# Patient Record
Sex: Male | Born: 1955 | Race: White | Hispanic: No | Marital: Married
Health system: Southern US, Community
[De-identification: ages and names within clinical notes are randomized; demographics above are authoritative.]

---

## 2003-07-14 ENCOUNTER — Ambulatory Visit (HOSPITAL_COMMUNITY): Admission: RE | Admit: 2003-07-14 | Discharge: 2003-07-14 | Payer: Self-pay | Admitting: Surgery

## 2003-07-15 ENCOUNTER — Ambulatory Visit (HOSPITAL_BASED_OUTPATIENT_CLINIC_OR_DEPARTMENT_OTHER): Admission: RE | Admit: 2003-07-15 | Discharge: 2003-07-15 | Payer: Self-pay | Admitting: Surgery

## 2010-09-08 ENCOUNTER — Other Ambulatory Visit: Payer: Self-pay

## 2010-09-21 ENCOUNTER — Ambulatory Visit: Payer: Self-pay | Admitting: Infectious Diseases

## 2015-06-30 ENCOUNTER — Other Ambulatory Visit: Payer: Self-pay | Admitting: Family Medicine

## 2015-06-30 ENCOUNTER — Ambulatory Visit
Admission: RE | Admit: 2015-06-30 | Discharge: 2015-06-30 | Disposition: A | Discharge status: Home / Self Care | Payer: BLUE CROSS/BLUE SHIELD | Source: Ambulatory Visit | Attending: Family Medicine | Admitting: Family Medicine

## 2015-06-30 DIAGNOSIS — R05 Cough: Secondary | ICD-10-CM

## 2015-06-30 DIAGNOSIS — R059 Cough, unspecified: Secondary | ICD-10-CM

## 2016-04-24 IMAGING — CR DG CHEST 2V
2 series · 2 of 2 positions shown · non-contrast
Comparison: None.

CLINICAL DATA: Cough and congestion for 5 days

EXAM:
CHEST  2 VIEW

[w chest pa]
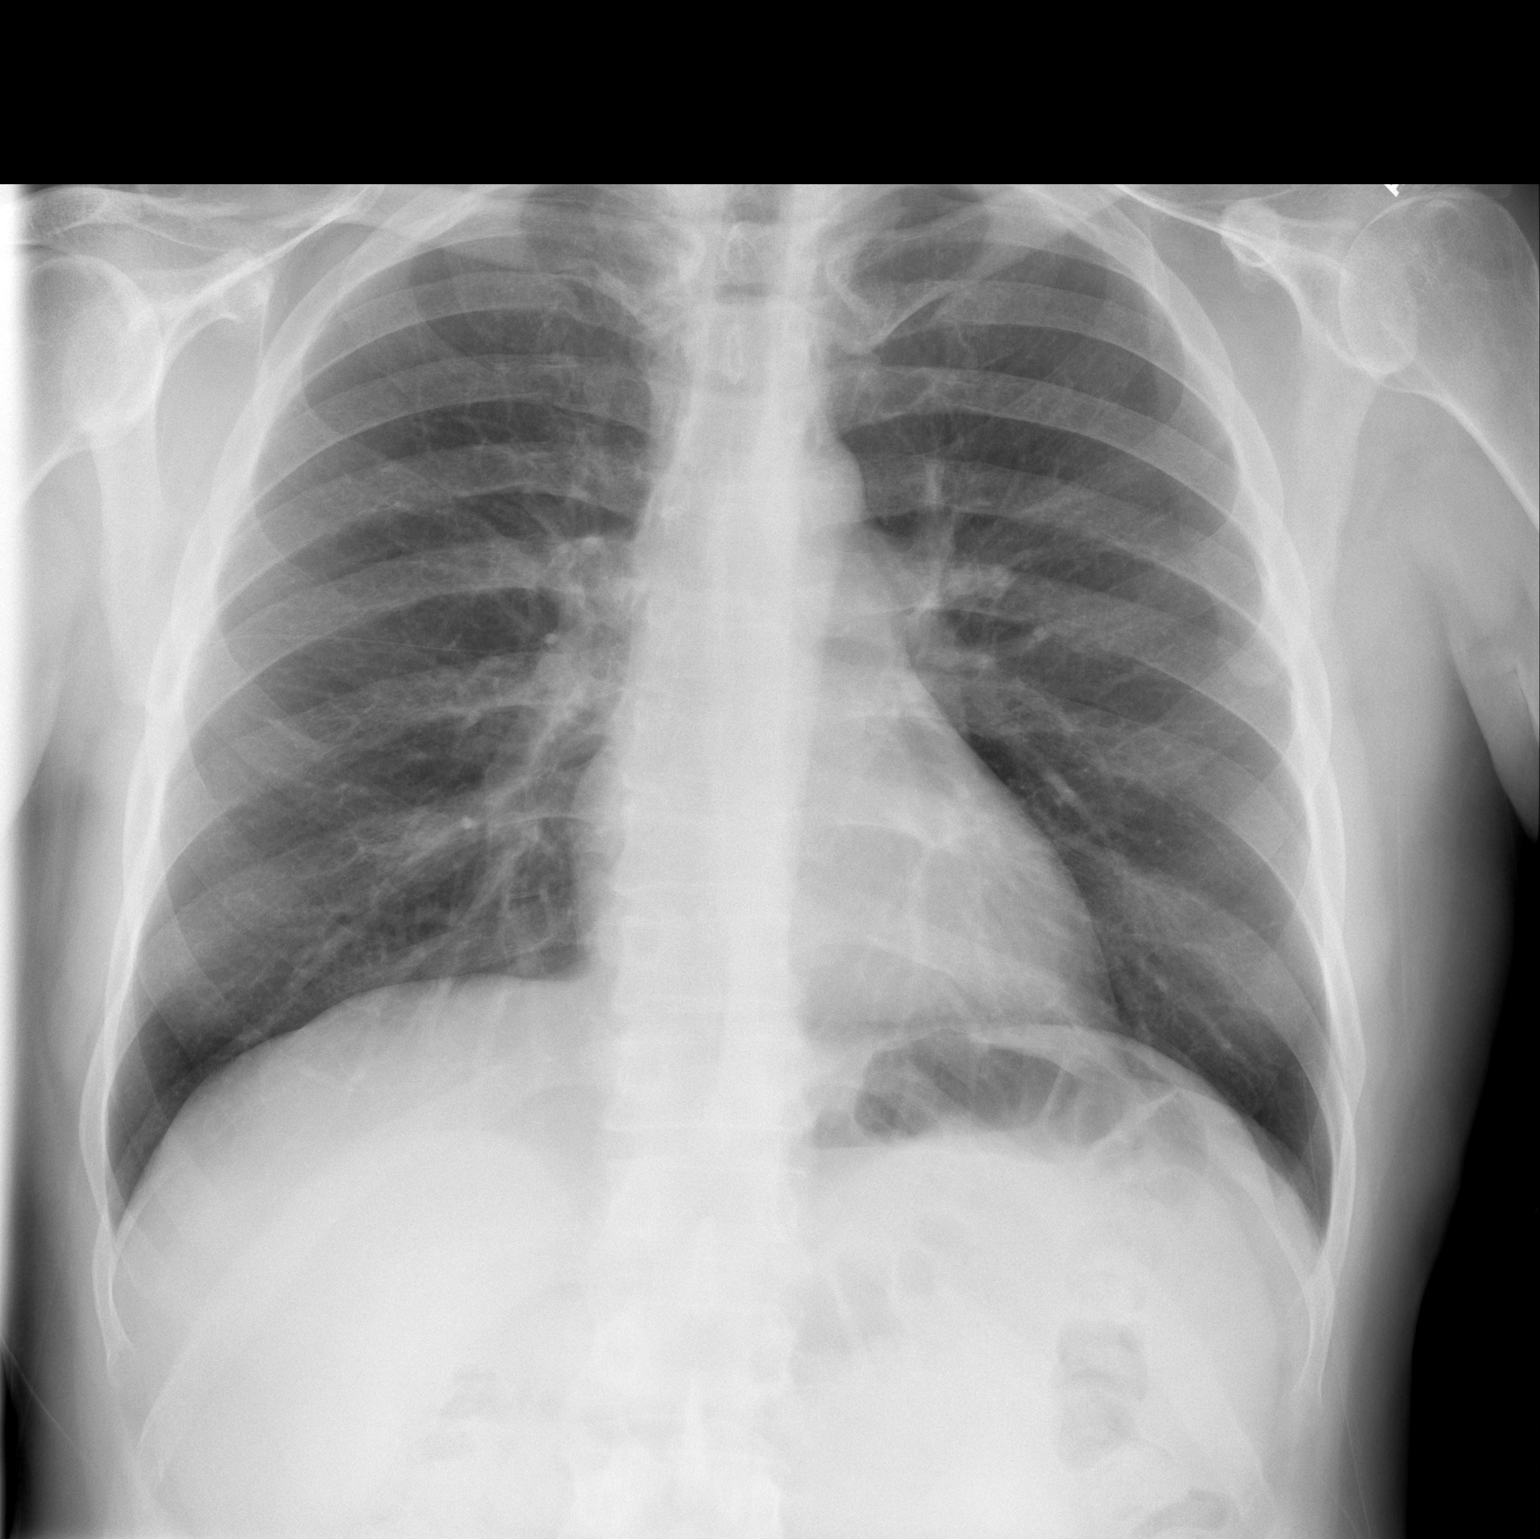

[w chest lat]
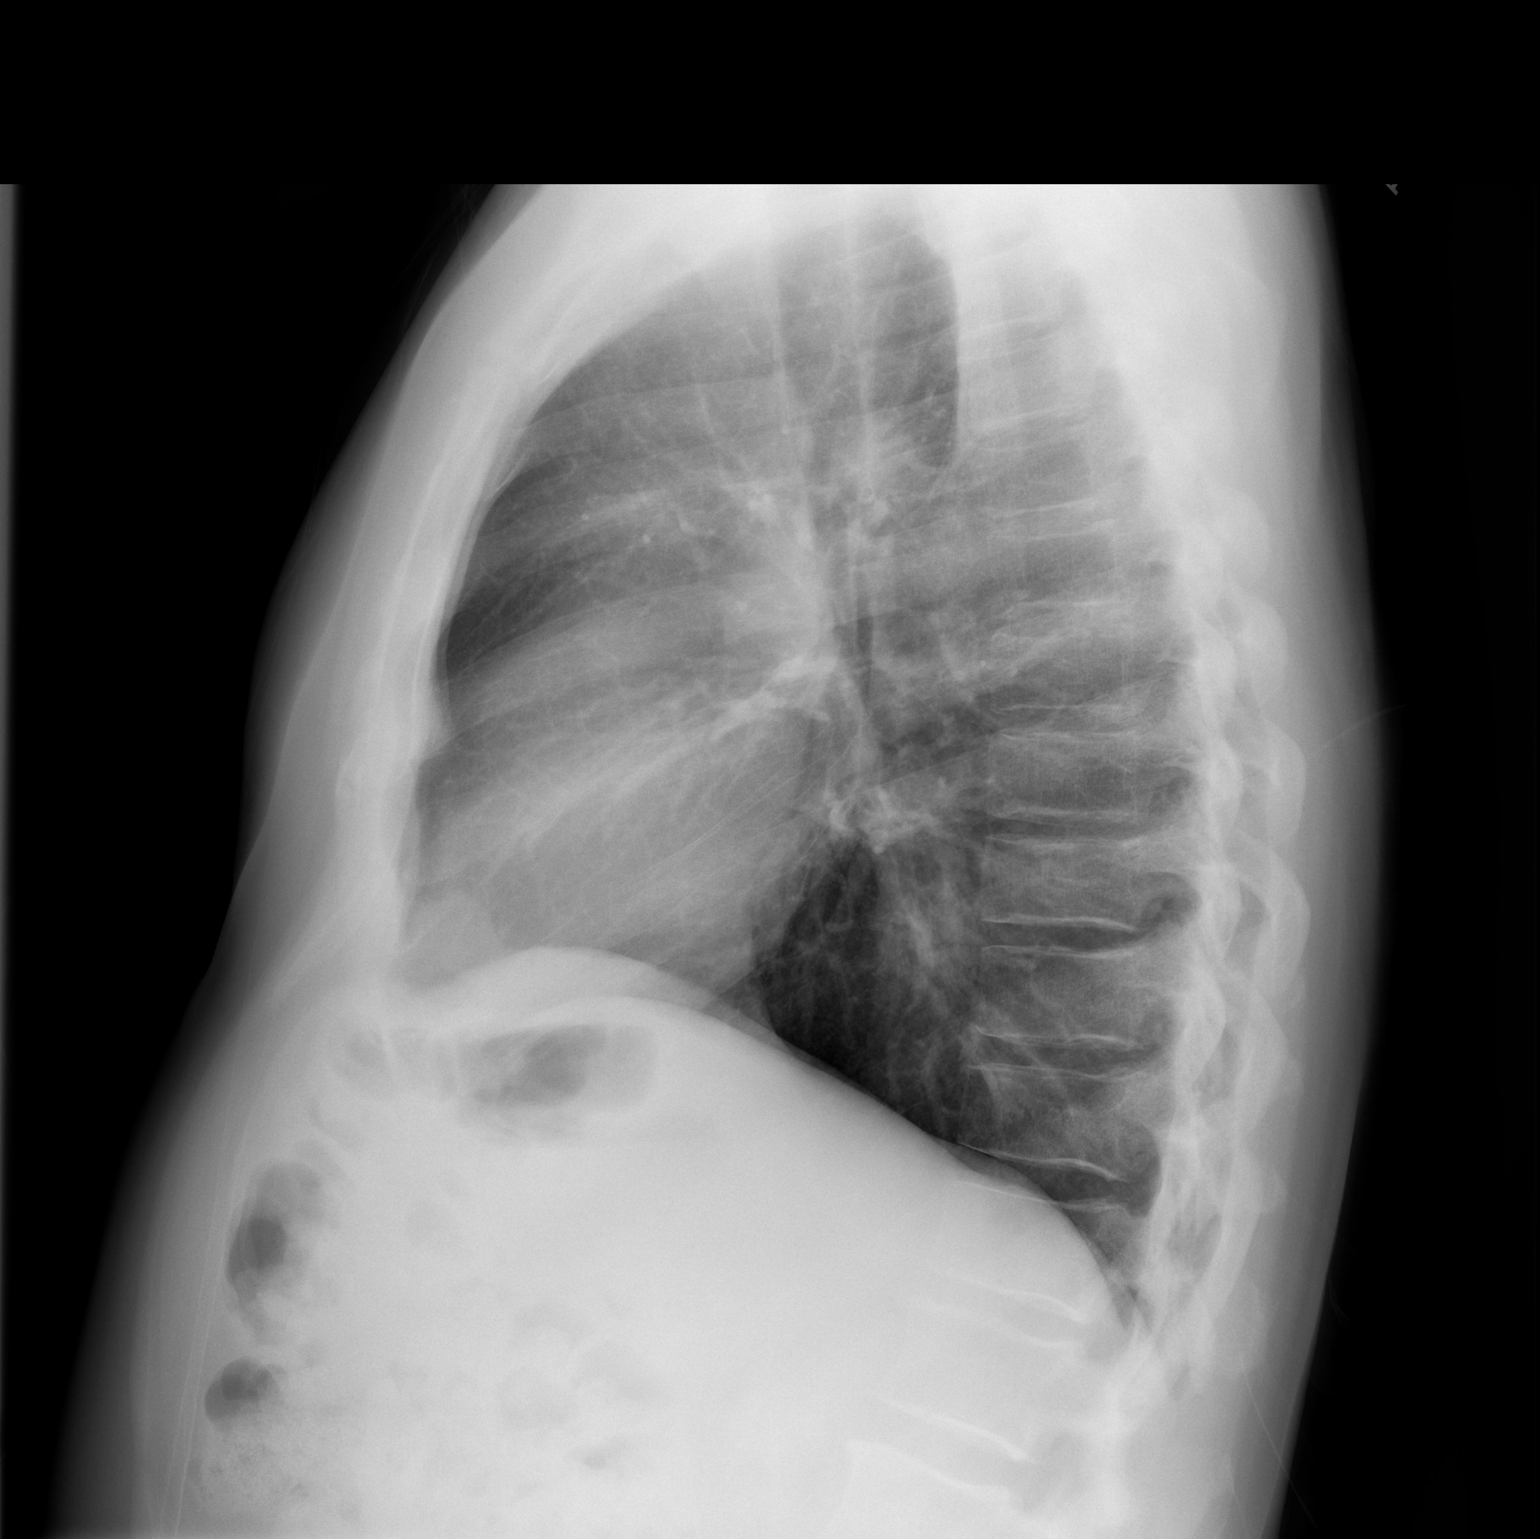

[2 of 2 positions shown; findings below may reference images not displayed]

FINDINGS: Lungs are clear. Heart size and pulmonary vascularity are normal. No
adenopathy. No bone lesions.
IMPRESSION: No edema or consolidation.

## 2017-08-06 DIAGNOSIS — K219 Gastro-esophageal reflux disease without esophagitis: Secondary | ICD-10-CM | POA: Diagnosis not present

## 2017-08-06 DIAGNOSIS — Z0000 Encounter for general adult medical examination without abnormal findings: Secondary | ICD-10-CM | POA: Diagnosis not present

## 2017-08-06 DIAGNOSIS — R7301 Impaired fasting glucose: Secondary | ICD-10-CM | POA: Diagnosis not present

## 2017-08-12 DIAGNOSIS — M5416 Radiculopathy, lumbar region: Secondary | ICD-10-CM | POA: Diagnosis not present

## 2017-08-20 DIAGNOSIS — M545 Low back pain: Secondary | ICD-10-CM | POA: Diagnosis not present

## 2017-09-02 DIAGNOSIS — M48061 Spinal stenosis, lumbar region without neurogenic claudication: Secondary | ICD-10-CM | POA: Diagnosis not present

## 2017-09-10 ENCOUNTER — Other Ambulatory Visit: Payer: Self-pay | Admitting: Internal Medicine

## 2017-09-10 ENCOUNTER — Other Ambulatory Visit: Payer: Self-pay | Admitting: Family Medicine

## 2017-09-10 DIAGNOSIS — N281 Cyst of kidney, acquired: Secondary | ICD-10-CM

## 2017-09-11 ENCOUNTER — Ambulatory Visit
Admission: RE | Admit: 2017-09-11 | Discharge: 2017-09-11 | Disposition: A | Discharge status: Home / Self Care | Payer: BLUE CROSS/BLUE SHIELD | Source: Ambulatory Visit | Attending: Internal Medicine | Admitting: Internal Medicine

## 2017-09-11 DIAGNOSIS — N281 Cyst of kidney, acquired: Secondary | ICD-10-CM

## 2017-11-11 DIAGNOSIS — M5416 Radiculopathy, lumbar region: Secondary | ICD-10-CM | POA: Diagnosis not present

## 2017-11-18 DIAGNOSIS — M5416 Radiculopathy, lumbar region: Secondary | ICD-10-CM | POA: Diagnosis not present

## 2017-11-28 DIAGNOSIS — M5416 Radiculopathy, lumbar region: Secondary | ICD-10-CM | POA: Diagnosis not present

## 2018-09-08 DIAGNOSIS — R7301 Impaired fasting glucose: Secondary | ICD-10-CM | POA: Diagnosis not present

## 2018-09-08 DIAGNOSIS — Z0000 Encounter for general adult medical examination without abnormal findings: Secondary | ICD-10-CM | POA: Diagnosis not present

## 2018-09-08 DIAGNOSIS — Z13220 Encounter for screening for lipoid disorders: Secondary | ICD-10-CM | POA: Diagnosis not present

## 2019-04-18 IMAGING — US US RENAL
1 series · 14 of 25 positions shown · non-contrast
Comparison: None.

CLINICAL DATA: Renal cysts.

EXAM:
RENAL / URINARY TRACT ULTRASOUND COMPLETE

[Series 1: us renal · 0.22mm/px · 14 of 45 slices shown]
[im 1/45]
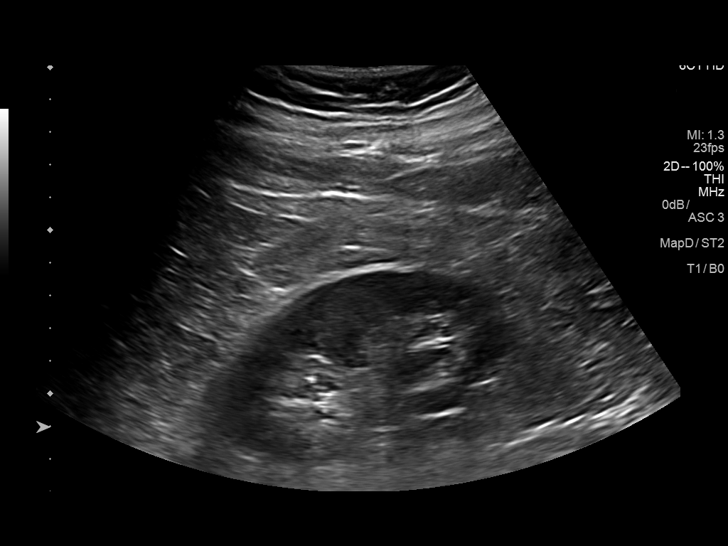
[im 4/45]
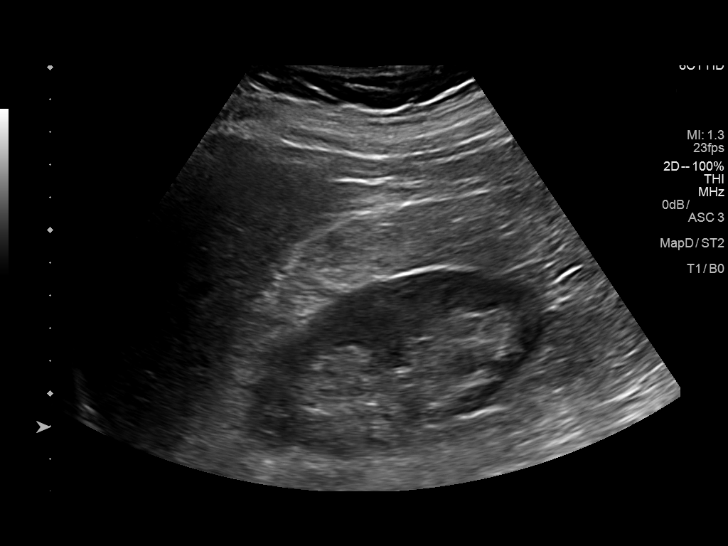
[im 8/45]
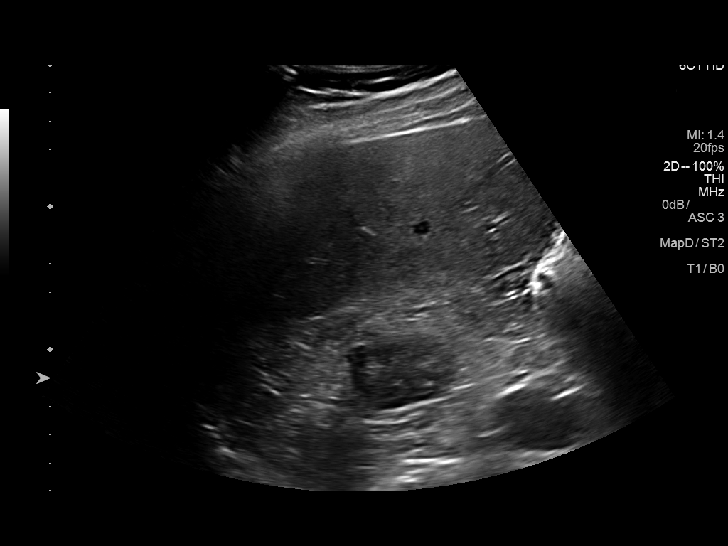
[im 12/45]
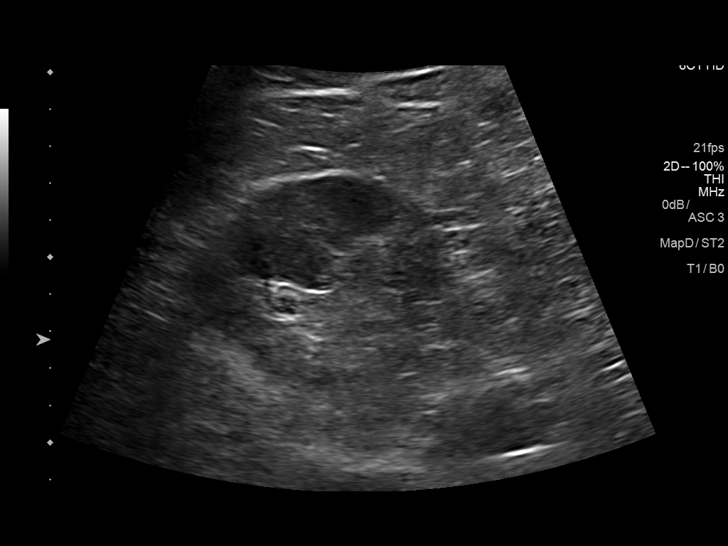
[im 15/45]
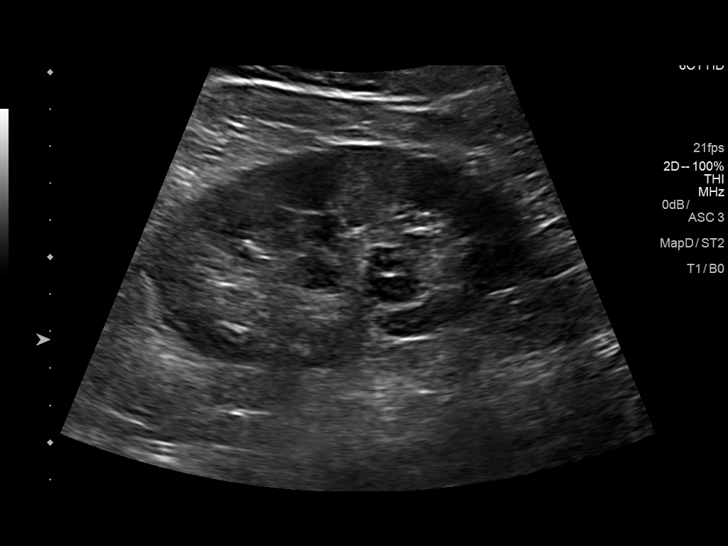
[im 17/45]
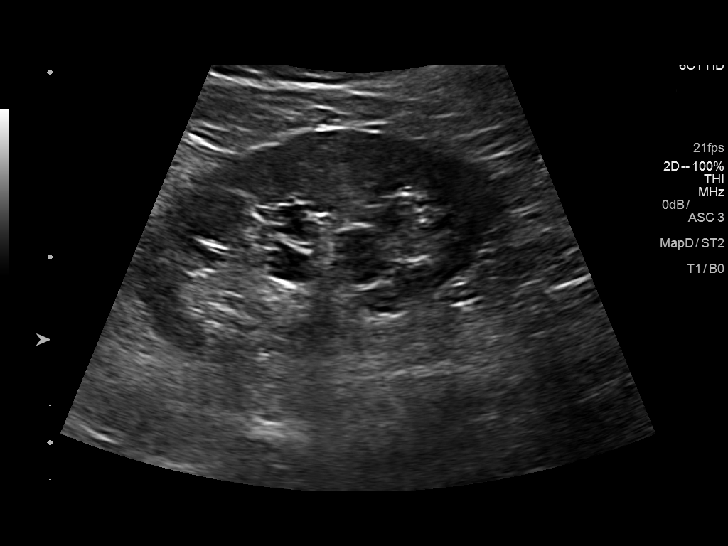
[im 21/45]
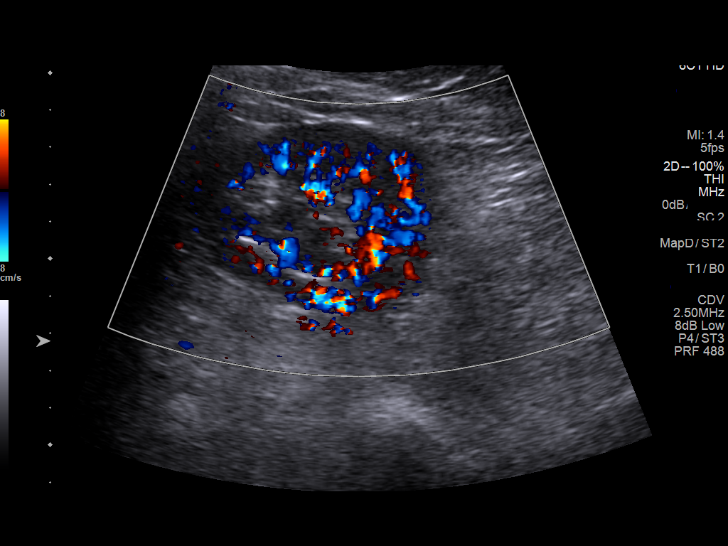
[im 24/45]
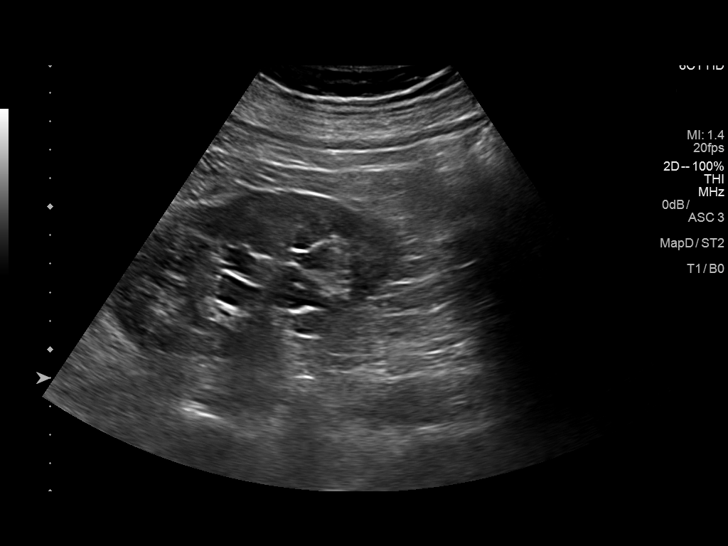
[im 28/45]
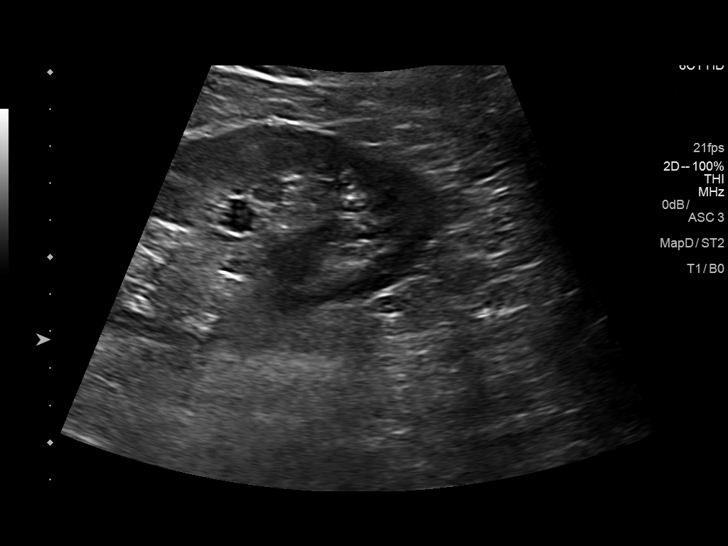
[im 30/45]
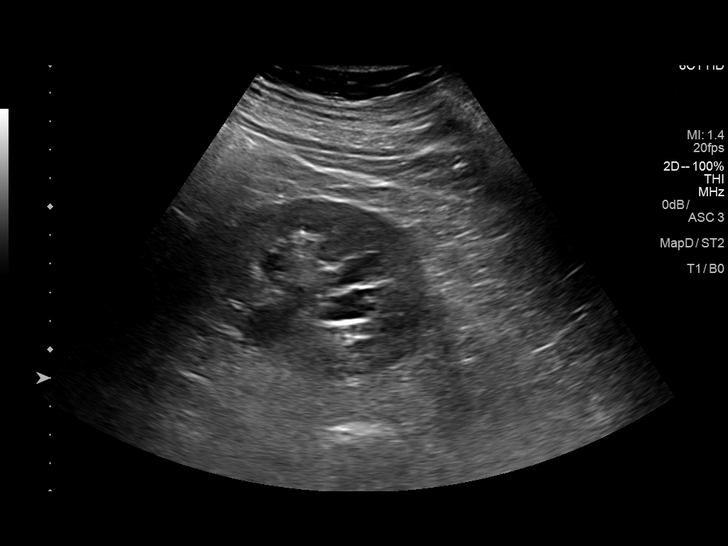
[im 34/45]
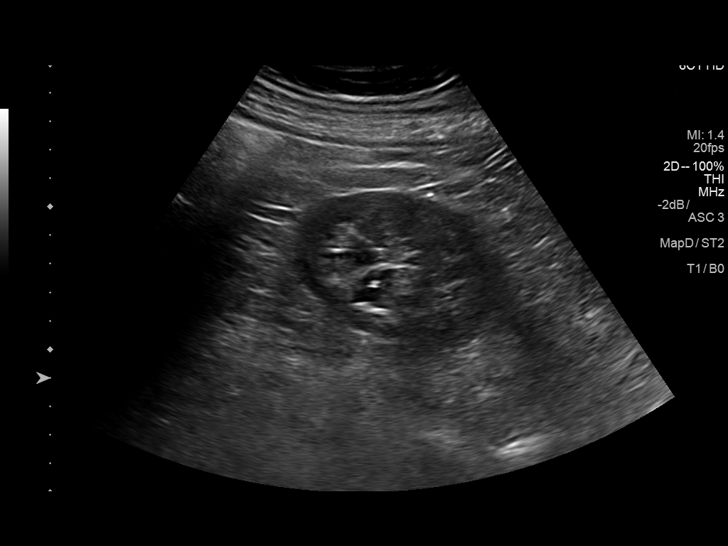
[im 37/45]
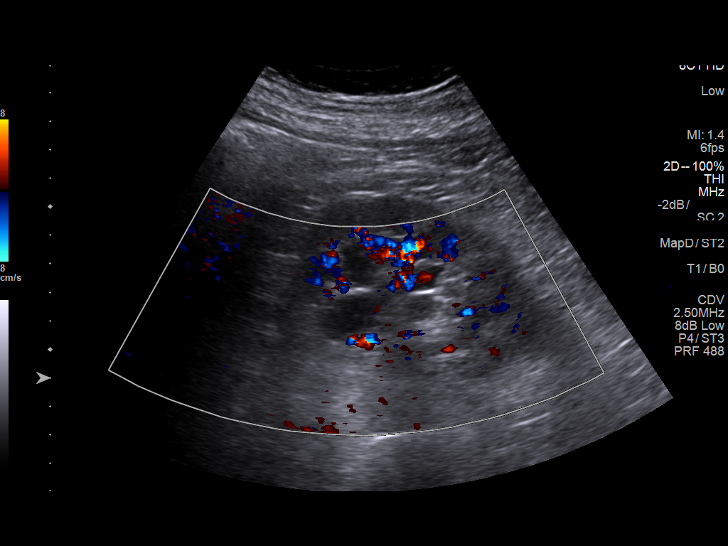
[im 41/45]
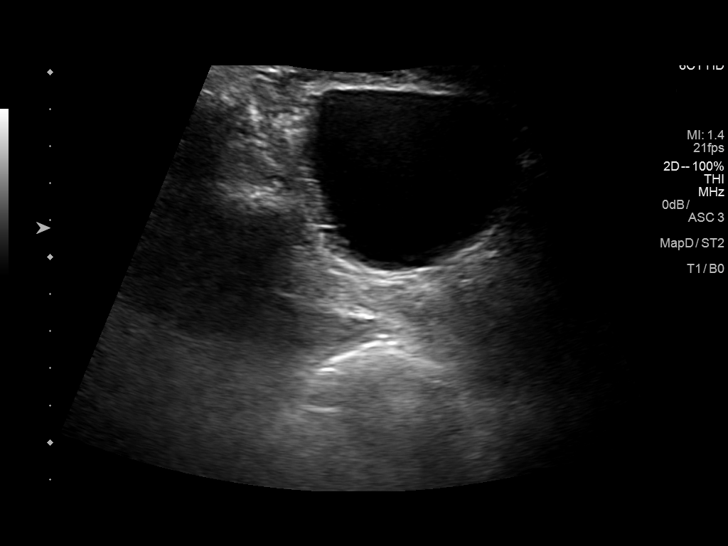
[im 45/45]
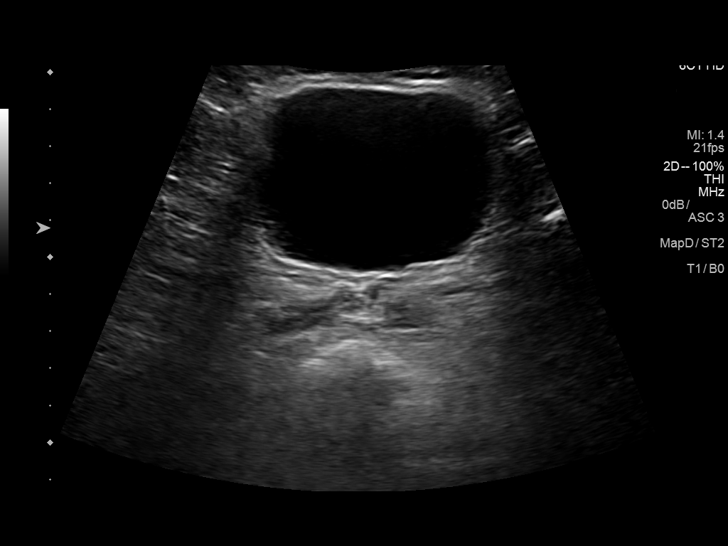

[14 of 25 positions shown; findings below may reference images not displayed]

FINDINGS: Right Kidney:

Length: 10.9 cm. Echogenicity within normal limits. No mass or
hydronephrosis visualized.

Left Kidney:

Length: 10.6 cm. Echogenicity within normal limits. No mass
visualized. Mild calyceal dilatation or parapelvic cysts is seen
involving the lower pole collecting system.

Bladder:

Appears normal for degree of bladder distention.
IMPRESSION: Mild calyceal dilatation or parapelvic cysts are seen in the lower
pole collecting system of the left kidney. No other renal
abnormality is noted.

## 2019-06-09 ENCOUNTER — Other Ambulatory Visit: Payer: Self-pay

## 2019-06-09 DIAGNOSIS — Z20822 Contact with and (suspected) exposure to covid-19: Secondary | ICD-10-CM

## 2019-06-11 LAB — NOVEL CORONAVIRUS, NAA: SARS-CoV-2, NAA: DETECTED — AB

## 2019-09-11 ENCOUNTER — Ambulatory Visit: Payer: 59 | Admitting: Podiatry

## 2019-09-11 ENCOUNTER — Other Ambulatory Visit: Payer: Self-pay

## 2019-09-11 ENCOUNTER — Ambulatory Visit (INDEPENDENT_AMBULATORY_CARE_PROVIDER_SITE_OTHER): Payer: 59

## 2019-09-11 ENCOUNTER — Encounter: Payer: Self-pay | Admitting: Podiatry

## 2019-09-11 VITALS — BP 111/82 | HR 65 | Temp 96.9°F | Resp 16

## 2019-09-11 DIAGNOSIS — M722 Plantar fascial fibromatosis: Secondary | ICD-10-CM

## 2019-09-11 NOTE — Progress Notes (Signed)
Subjective:   Patient ID: Rodney Fox, male   DOB: 64 y.o.   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  Patient states has become worse over the last couple weeks and he wore a flat type shoe.  Patient does not smoke and does like to be active   Review of Systems  All other systems reviewed and are negative.       Objective:  Physical Exam Vitals and nursing note reviewed.  Constitutional:      Appearance: He is well-developed.  Pulmonary:     Effort: Pulmonary effort is normal.  Musculoskeletal:        General: Normal range of motion.  Skin:    General: Skin is warm.  Neurological:     Mental Status: He is alert.     Neurovascular status intact muscle strength found to be adequate range of motion within normal limits.  Patient is found to have acute discomfort plantar aspect left humeral insertional point of the tendon into the calcaneus with inflammation fluid around the medial band.  Patient is found to have Fox digital perfusion is well oriented x3 and has minimal disruption or depression of the arch     Assessment:  Acute plantar fasciitis left with inflammation fluid of the medial band     Plan:  H&P reviewed conditions and recommended conservative treatment.  I did do sterile prep and I injected the fascial band 3 mg Kenalog 5 mg Xylocaine I applied fascial brace I gave instructions for physical therapy anti-inflammatories and supportive shoes.  Patient will be seen back again in 3 weeks or earlier if needed  X-rays indicate a light type of spur or calcification type process no indications of deep process

## 2019-09-11 NOTE — Patient Instructions (Signed)

## 2019-09-14 ENCOUNTER — Telehealth: Payer: Self-pay | Admitting: *Deleted

## 2019-09-14 NOTE — Telephone Encounter (Signed)
ERROR

## 2019-10-05 ENCOUNTER — Ambulatory Visit: Payer: 59 | Admitting: Podiatry

## 2020-10-03 DIAGNOSIS — Z0000 Encounter for general adult medical examination without abnormal findings: Secondary | ICD-10-CM | POA: Diagnosis not present

## 2020-10-03 DIAGNOSIS — Z1159 Encounter for screening for other viral diseases: Secondary | ICD-10-CM | POA: Diagnosis not present

## 2020-10-03 DIAGNOSIS — R7301 Impaired fasting glucose: Secondary | ICD-10-CM | POA: Diagnosis not present

## 2020-10-03 DIAGNOSIS — Z125 Encounter for screening for malignant neoplasm of prostate: Secondary | ICD-10-CM | POA: Diagnosis not present

## 2020-10-03 DIAGNOSIS — M4807 Spinal stenosis, lumbosacral region: Secondary | ICD-10-CM | POA: Diagnosis not present

## 2020-10-03 DIAGNOSIS — K219 Gastro-esophageal reflux disease without esophagitis: Secondary | ICD-10-CM | POA: Diagnosis not present

## 2020-10-03 DIAGNOSIS — Z13220 Encounter for screening for lipoid disorders: Secondary | ICD-10-CM | POA: Diagnosis not present

## 2020-10-03 DIAGNOSIS — Z23 Encounter for immunization: Secondary | ICD-10-CM | POA: Diagnosis not present

## 2021-10-06 DIAGNOSIS — Z0000 Encounter for general adult medical examination without abnormal findings: Secondary | ICD-10-CM | POA: Diagnosis not present

## 2021-10-06 DIAGNOSIS — R030 Elevated blood-pressure reading, without diagnosis of hypertension: Secondary | ICD-10-CM | POA: Diagnosis not present

## 2021-10-06 DIAGNOSIS — E785 Hyperlipidemia, unspecified: Secondary | ICD-10-CM | POA: Diagnosis not present

## 2021-10-06 DIAGNOSIS — K219 Gastro-esophageal reflux disease without esophagitis: Secondary | ICD-10-CM | POA: Diagnosis not present

## 2021-10-06 DIAGNOSIS — L989 Disorder of the skin and subcutaneous tissue, unspecified: Secondary | ICD-10-CM | POA: Diagnosis not present

## 2021-10-06 DIAGNOSIS — M4807 Spinal stenosis, lumbosacral region: Secondary | ICD-10-CM | POA: Diagnosis not present

## 2021-10-06 DIAGNOSIS — R7301 Impaired fasting glucose: Secondary | ICD-10-CM | POA: Diagnosis not present

## 2021-10-31 DIAGNOSIS — L570 Actinic keratosis: Secondary | ICD-10-CM | POA: Diagnosis not present

## 2021-10-31 DIAGNOSIS — C441191 Basal cell carcinoma of skin of left upper eyelid, including canthus: Secondary | ICD-10-CM | POA: Diagnosis not present

## 2021-10-31 DIAGNOSIS — X32XXXA Exposure to sunlight, initial encounter: Secondary | ICD-10-CM | POA: Diagnosis not present

## 2022-01-30 DIAGNOSIS — D485 Neoplasm of uncertain behavior of skin: Secondary | ICD-10-CM | POA: Diagnosis not present

## 2022-01-30 DIAGNOSIS — L821 Other seborrheic keratosis: Secondary | ICD-10-CM | POA: Diagnosis not present

## 2022-01-30 DIAGNOSIS — C441191 Basal cell carcinoma of skin of left upper eyelid, including canthus: Secondary | ICD-10-CM | POA: Diagnosis not present

## 2022-03-27 DIAGNOSIS — C441191 Basal cell carcinoma of skin of left upper eyelid, including canthus: Secondary | ICD-10-CM | POA: Diagnosis not present

## 2022-03-28 DIAGNOSIS — C441191 Basal cell carcinoma of skin of left upper eyelid, including canthus: Secondary | ICD-10-CM | POA: Diagnosis not present

## 2022-04-17 DIAGNOSIS — D485 Neoplasm of uncertain behavior of skin: Secondary | ICD-10-CM | POA: Diagnosis not present

## 2022-04-17 DIAGNOSIS — D0439 Carcinoma in situ of skin of other parts of face: Secondary | ICD-10-CM | POA: Diagnosis not present

## 2022-05-02 DIAGNOSIS — Z85828 Personal history of other malignant neoplasm of skin: Secondary | ICD-10-CM | POA: Diagnosis not present

## 2022-05-02 DIAGNOSIS — Z08 Encounter for follow-up examination after completed treatment for malignant neoplasm: Secondary | ICD-10-CM | POA: Diagnosis not present

## 2022-05-22 DIAGNOSIS — D485 Neoplasm of uncertain behavior of skin: Secondary | ICD-10-CM | POA: Diagnosis not present

## 2022-05-23 DIAGNOSIS — L986 Other infiltrative disorders of the skin and subcutaneous tissue: Secondary | ICD-10-CM | POA: Diagnosis not present

## 2022-05-23 DIAGNOSIS — L720 Epidermal cyst: Secondary | ICD-10-CM | POA: Diagnosis not present

## 2022-05-23 DIAGNOSIS — L905 Scar conditions and fibrosis of skin: Secondary | ICD-10-CM | POA: Diagnosis not present

## 2022-07-24 DIAGNOSIS — Q103 Other congenital malformations of eyelid: Secondary | ICD-10-CM | POA: Diagnosis not present

## 2022-10-09 DIAGNOSIS — Z1331 Encounter for screening for depression: Secondary | ICD-10-CM | POA: Diagnosis not present

## 2022-10-09 DIAGNOSIS — Z0000 Encounter for general adult medical examination without abnormal findings: Secondary | ICD-10-CM | POA: Diagnosis not present

## 2022-10-09 DIAGNOSIS — K219 Gastro-esophageal reflux disease without esophagitis: Secondary | ICD-10-CM | POA: Diagnosis not present

## 2022-10-09 DIAGNOSIS — Z125 Encounter for screening for malignant neoplasm of prostate: Secondary | ICD-10-CM | POA: Diagnosis not present

## 2022-10-09 DIAGNOSIS — E785 Hyperlipidemia, unspecified: Secondary | ICD-10-CM | POA: Diagnosis not present

## 2022-10-09 DIAGNOSIS — M4807 Spinal stenosis, lumbosacral region: Secondary | ICD-10-CM | POA: Diagnosis not present

## 2022-10-09 DIAGNOSIS — Z85828 Personal history of other malignant neoplasm of skin: Secondary | ICD-10-CM | POA: Diagnosis not present

## 2022-10-09 DIAGNOSIS — M25569 Pain in unspecified knee: Secondary | ICD-10-CM | POA: Diagnosis not present

## 2022-10-09 DIAGNOSIS — R7301 Impaired fasting glucose: Secondary | ICD-10-CM | POA: Diagnosis not present

## 2022-10-16 DIAGNOSIS — M1711 Unilateral primary osteoarthritis, right knee: Secondary | ICD-10-CM | POA: Diagnosis not present

## 2022-10-16 DIAGNOSIS — M1712 Unilateral primary osteoarthritis, left knee: Secondary | ICD-10-CM | POA: Diagnosis not present

## 2022-10-16 DIAGNOSIS — M170 Bilateral primary osteoarthritis of knee: Secondary | ICD-10-CM | POA: Diagnosis not present

## 2022-10-29 DIAGNOSIS — R262 Difficulty in walking, not elsewhere classified: Secondary | ICD-10-CM | POA: Diagnosis not present

## 2022-10-29 DIAGNOSIS — M1732 Unilateral post-traumatic osteoarthritis, left knee: Secondary | ICD-10-CM | POA: Diagnosis not present

## 2022-10-29 DIAGNOSIS — M25662 Stiffness of left knee, not elsewhere classified: Secondary | ICD-10-CM | POA: Diagnosis not present

## 2022-11-14 DIAGNOSIS — M25562 Pain in left knee: Secondary | ICD-10-CM | POA: Diagnosis not present

## 2022-11-14 DIAGNOSIS — M1712 Unilateral primary osteoarthritis, left knee: Secondary | ICD-10-CM | POA: Diagnosis not present

## 2022-11-23 DIAGNOSIS — M21162 Varus deformity, not elsewhere classified, left knee: Secondary | ICD-10-CM | POA: Diagnosis not present

## 2022-11-23 DIAGNOSIS — G8918 Other acute postprocedural pain: Secondary | ICD-10-CM | POA: Diagnosis not present

## 2022-11-23 DIAGNOSIS — Z96652 Presence of left artificial knee joint: Secondary | ICD-10-CM | POA: Diagnosis not present

## 2022-11-23 DIAGNOSIS — M1712 Unilateral primary osteoarthritis, left knee: Secondary | ICD-10-CM | POA: Diagnosis not present

## 2022-11-26 DIAGNOSIS — M25662 Stiffness of left knee, not elsewhere classified: Secondary | ICD-10-CM | POA: Diagnosis not present

## 2022-11-26 DIAGNOSIS — M6281 Muscle weakness (generalized): Secondary | ICD-10-CM | POA: Diagnosis not present

## 2022-11-26 DIAGNOSIS — Z96652 Presence of left artificial knee joint: Secondary | ICD-10-CM | POA: Diagnosis not present

## 2022-11-28 DIAGNOSIS — Z96652 Presence of left artificial knee joint: Secondary | ICD-10-CM | POA: Diagnosis not present

## 2022-11-28 DIAGNOSIS — M25662 Stiffness of left knee, not elsewhere classified: Secondary | ICD-10-CM | POA: Diagnosis not present

## 2022-11-28 DIAGNOSIS — M6281 Muscle weakness (generalized): Secondary | ICD-10-CM | POA: Diagnosis not present

## 2022-11-30 DIAGNOSIS — M6281 Muscle weakness (generalized): Secondary | ICD-10-CM | POA: Diagnosis not present

## 2022-11-30 DIAGNOSIS — M25662 Stiffness of left knee, not elsewhere classified: Secondary | ICD-10-CM | POA: Diagnosis not present

## 2022-11-30 DIAGNOSIS — Z96652 Presence of left artificial knee joint: Secondary | ICD-10-CM | POA: Diagnosis not present

## 2022-12-05 DIAGNOSIS — M6281 Muscle weakness (generalized): Secondary | ICD-10-CM | POA: Diagnosis not present

## 2022-12-05 DIAGNOSIS — M25662 Stiffness of left knee, not elsewhere classified: Secondary | ICD-10-CM | POA: Diagnosis not present

## 2022-12-05 DIAGNOSIS — Z96652 Presence of left artificial knee joint: Secondary | ICD-10-CM | POA: Diagnosis not present

## 2022-12-06 DIAGNOSIS — Z96652 Presence of left artificial knee joint: Secondary | ICD-10-CM | POA: Diagnosis not present

## 2022-12-06 DIAGNOSIS — Z471 Aftercare following joint replacement surgery: Secondary | ICD-10-CM | POA: Diagnosis not present

## 2022-12-06 DIAGNOSIS — M1712 Unilateral primary osteoarthritis, left knee: Secondary | ICD-10-CM | POA: Diagnosis not present

## 2022-12-07 DIAGNOSIS — Z96652 Presence of left artificial knee joint: Secondary | ICD-10-CM | POA: Diagnosis not present

## 2022-12-07 DIAGNOSIS — M25662 Stiffness of left knee, not elsewhere classified: Secondary | ICD-10-CM | POA: Diagnosis not present

## 2022-12-07 DIAGNOSIS — M6281 Muscle weakness (generalized): Secondary | ICD-10-CM | POA: Diagnosis not present

## 2022-12-10 DIAGNOSIS — M25662 Stiffness of left knee, not elsewhere classified: Secondary | ICD-10-CM | POA: Diagnosis not present

## 2022-12-10 DIAGNOSIS — Z96652 Presence of left artificial knee joint: Secondary | ICD-10-CM | POA: Diagnosis not present

## 2022-12-10 DIAGNOSIS — M6281 Muscle weakness (generalized): Secondary | ICD-10-CM | POA: Diagnosis not present

## 2022-12-12 DIAGNOSIS — Z96652 Presence of left artificial knee joint: Secondary | ICD-10-CM | POA: Diagnosis not present

## 2022-12-12 DIAGNOSIS — M25662 Stiffness of left knee, not elsewhere classified: Secondary | ICD-10-CM | POA: Diagnosis not present

## 2022-12-12 DIAGNOSIS — M6281 Muscle weakness (generalized): Secondary | ICD-10-CM | POA: Diagnosis not present

## 2022-12-17 DIAGNOSIS — M25662 Stiffness of left knee, not elsewhere classified: Secondary | ICD-10-CM | POA: Diagnosis not present

## 2022-12-17 DIAGNOSIS — M6281 Muscle weakness (generalized): Secondary | ICD-10-CM | POA: Diagnosis not present

## 2022-12-17 DIAGNOSIS — Z96652 Presence of left artificial knee joint: Secondary | ICD-10-CM | POA: Diagnosis not present

## 2022-12-19 DIAGNOSIS — M6281 Muscle weakness (generalized): Secondary | ICD-10-CM | POA: Diagnosis not present

## 2022-12-19 DIAGNOSIS — M25662 Stiffness of left knee, not elsewhere classified: Secondary | ICD-10-CM | POA: Diagnosis not present

## 2022-12-19 DIAGNOSIS — Z96652 Presence of left artificial knee joint: Secondary | ICD-10-CM | POA: Diagnosis not present

## 2024-05-29 ENCOUNTER — Other Ambulatory Visit (HOSPITAL_BASED_OUTPATIENT_CLINIC_OR_DEPARTMENT_OTHER): Payer: Self-pay | Admitting: Orthopedic Surgery

## 2024-05-29 ENCOUNTER — Encounter (HOSPITAL_BASED_OUTPATIENT_CLINIC_OR_DEPARTMENT_OTHER): Payer: Self-pay

## 2024-05-29 ENCOUNTER — Ambulatory Visit (HOSPITAL_BASED_OUTPATIENT_CLINIC_OR_DEPARTMENT_OTHER)
Admission: RE | Admit: 2024-05-29 | Discharge: 2024-05-29 | Disposition: A | Discharge status: Home / Self Care | Source: Ambulatory Visit | Attending: Orthopedic Surgery | Admitting: Orthopedic Surgery

## 2024-05-29 DIAGNOSIS — Z96651 Presence of right artificial knee joint: Secondary | ICD-10-CM

## 2024-06-10 ENCOUNTER — Ambulatory Visit: Attending: Vascular Surgery | Admitting: Student-PharmD

## 2024-06-10 ENCOUNTER — Telehealth: Payer: Self-pay

## 2024-06-10 ENCOUNTER — Encounter: Payer: Self-pay | Admitting: Student-PharmD

## 2024-06-10 ENCOUNTER — Other Ambulatory Visit (HOSPITAL_COMMUNITY): Payer: Self-pay

## 2024-06-10 VITALS — BP 132/72 | HR 72

## 2024-06-10 DIAGNOSIS — I82441 Acute embolism and thrombosis of right tibial vein: Secondary | ICD-10-CM | POA: Diagnosis not present

## 2024-06-10 MED ORDER — APIXABAN 5 MG PO TABS
5.0000 mg | ORAL_TABLET | Freq: Two times a day (BID) | ORAL | 0 refills | Status: AC
  Filled 2024-06-10: qty 120, 60d supply, fill #0

## 2024-06-10 NOTE — Patient Instructions (Signed)
-  Continue apixaban (Eliquis) 5 mg twice daily. -Your refills have been sent to our pharmacy downstairs.  -It is important to take your medication around the same time every day.  -Avoid NSAIDs like ibuprofen (Advil, Motrin) and naproxen (Aleve) as well as aspirin doses over 100 mg daily. -Tylenol (acetaminophen) is the preferred over the counter pain medication to lower the risk of bleeding. -Be sure to alert all of your health care providers that you are taking an anticoagulant prior to starting a new medication or having a procedure. -Monitor for signs and symptoms of bleeding (abnormal bruising, prolonged bleeding, nose bleeds, bleeding from gums, discolored urine, black tarry stools). If you have fallen and hit your head OR if your bleeding is severe or not stopping, seek emergency care.  -Go to the emergency room if emergent signs and symptoms of new clot occur (new or worse swelling and pain in an arm or leg, shortness of breath, chest pain, fast or irregular heartbeats, lightheadedness, dizziness, fainting, coughing up blood) or if you experience a significant color change (pale or blue) in the extremity that has the DVT.  -We recommend you wear compression stockings (20-30 mmHg) as long as you are having swelling or pain. Be sure to purchase the correct size and take them off at night.   If you have any questions or need to reschedule an appointment, please call [phone]. If you are having an emergency, call 911 or present to the nearest emergency room.   What is a DVT?  -Deep vein thrombosis (DVT) is a condition in which a blood clot forms in a vein of the deep venous system which can occur in the lower leg, thigh, pelvis, arm, or neck. This condition is serious and can be life-threatening if the clot travels to the arteries of the lungs and causing a blockage (pulmonary embolism, PE). A DVT can also damage veins in the leg, which can lead to long-term venous disease, leg pain, swelling,  discoloration, and ulcers or sores (post-thrombotic syndrome).  -Treatment may include taking an anticoagulant medication to prevent more clots from forming and the current clot from growing, wearing compression stockings, and/or surgical procedures to remove or dissolve the clot.

## 2024-06-10 NOTE — Telephone Encounter (Signed)
 Patient Advocate Encounter  Test billing for this patient's current coverage (Rx Advance / [employer] Advg) returns a $94 copay for 60 day supply of Eliquis.  This test claim was processed through [facility]- copay amounts may vary at other pharmacies due to pharmacy/plan contracts, or as the patient moves through the different stages of their insurance plan.  Rachel DEL, CPhT Rx Patient Advocate Phone: [phone]

## 2024-06-10 NOTE — Progress Notes (Signed)
 DVT Clinic Note  Name: Rodney Fox     MRN: [id number]     DOB: 1956-05-25     Sex: male  PCP: Hugh Charleston, MD (Inactive)  Today's Visit: Visit Information: Initial Visit  Referred to DVT Clinic by: Orthopedic Surgery - Dr. Yvone Referred to CPP by: Dr. Sheree Reason for referral:  Chief Complaint  Patient presents with   Med Management - DVT   HISTORY OF PRESENT ILLNESS: BENEDICT KUE is a 68 y.o. male with PMH GERD who presents after diagnosis of DVT for medication management. 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 He was seen by ortho for follow up on 05/29/24 and reported not taking aspirin 325 mg due to a misunderstanding and had more than expected swelling and pain in his calf. Ultrasound showed DVT in one of the paired right posterior tibial veins. Ortho started him on the Eliquis starter pack and referred him to DVT Clinic for follow up. Today, patient reports that the swelling and pain in his calf has improved. Still having some swelling and pain related to the knee replacement. Denies abnormal bleeding or bruising. Denies missed doses of Eliquis. Takes it at 8am and 8pm. Is wearing compression stockings.   Positive Thrombotic Risk Factors: Recent surgery (within 3 months) Bleeding Risk Factors: Age >65 years, Anticoagulant therapy  Negative Thrombotic Risk Factors: Previous VTE, Recent trauma (within 3 months), Recent admission to hospital with acute illness (within 3 months), Paralysis, paresis, or recent plaster cast immobilization of lower extremity, Central venous catheterization, Bed rest >72 hours within 3 months, Sedentary journey lasting >8 hours within 4 weeks, Pregnancy, Within 6 weeks postpartum, Recent cesarean section (within 3 months), Estrogen therapy, Testosterone therapy, Erythropoiesis-stimulating agent, Recent COVID diagnosis (within 3 months), Active cancer, Non-malignant, chronic inflammatory condition, Known thrombophilic condition, Smoking, Obesity,  Older age  Rx Insurance Coverage: Medicare Rx Affordability: Eliquis is $47/month Rx Assistance Provided: None needed at this time Preferred Pharmacy: [facility]  History reviewed. No pertinent past medical history.  History reviewed. No pertinent surgical history.  Social History   Socioeconomic History   Marital status: Married    Spouse name: Not on file   Number of children: Not on file   Years of education: Not on file   Highest education level: Not on file  Occupational History   Not on file  Tobacco Use   Smoking status: Unknown   Smokeless tobacco: Never  Substance and Sexual Activity   Alcohol use: Not on file   Drug use: Not on file   Sexual activity: Not on file  Other Topics Concern   Not on file  Social History Narrative   Not on file   Social Drivers of Health   Financial Resource Strain: Not on file  Food Insecurity: Not on file  Transportation Needs: Not on file  Physical Activity: Not on file  Stress: Not on file  Social Connections: Not on file  Intimate Partner Violence: Not on file    History reviewed. No pertinent family history.  Allergies as of 06/10/2024   (No Known Allergies)    Current Outpatient Medications on File Prior to Visit  Medication Sig Dispense Refill   acetaminophen (TYLENOL) 500 MG tablet Take 1,000 mg by mouth every 6 (six) hours as needed for mild pain (pain score 1-3) or moderate pain (pain score 4-6).     ELIQUIS DVT/PE STARTER PACK Take by mouth.     lansoprazole (PREVACID) 30 MG capsule Take 30 mg  by mouth every other day.     oxyCODONE (OXY IR/ROXICODONE) 5 MG immediate release tablet Take 5 mg by mouth every 4 (four) hours as needed for severe pain (pain score 7-10).     No current facility-administered medications on file prior to visit.   REVIEW OF SYSTEMS:  Review of Systems  Respiratory:  Negative for shortness of breath.   Cardiovascular:  Positive for leg swelling. Negative for chest pain and palpitations.   Musculoskeletal:  Positive for joint pain (right knee).  Neurological:  Negative for dizziness and tingling.   PHYSICAL EXAMINATION:  Vitals:   06/10/24 1114  BP: 132/72  Pulse: 72  SpO2: 99%   Physical Exam Vitals reviewed.  Cardiovascular:     Rate and Rhythm: Normal rate.  Pulmonary:     Effort: Pulmonary effort is normal.  Musculoskeletal:        General: Tenderness present.     Right lower leg: Edema present.     Left lower leg: No edema.  Skin:    Findings: No bruising or erythema.  Psychiatric:        Mood and Affect: Mood normal.        Behavior: Behavior normal.        Thought Content: Thought content normal.   Villalta Score for Post-Thrombotic Syndrome: Pain: Mild Cramps: Absent Heaviness: Mild Paresthesia: Absent Pruritus: Absent Pretibial Edema: Moderate Skin Induration: Absent Hyperpigmentation: Absent Redness: Absent Venous Ectasia: Absent Pain on calf compression: Mild Villalta Preliminary Score: 5 Is venous ulcer present?: No If venous ulcer is present and score is <15, then 15 points total are assigned: Absent Villalta Total Score: 5  LABS:  CBC  No results found for: WBC, RBC, HGB, HCT, PLT, MCV, MCH, MCHC, RDW, LYMPHSABS, MONOABS, EOSABS, BASOSABS  Hepatic Function   No results found for: PROT, ALBUMIN, AST, ALT, ALKPHOS, BILITOT, BILIDIR, IBILI  Renal Function   No results found for: CREATININE  CrCl cannot be calculated (No successful lab value found.).   Labs available in [facility] from [facility]:  05/05/24 ALT 17, AST 19, eGFR 87, Hgb 13.1  [facility] Vascular Lab Studies:  05/29/24 US  Venous FINDINGS: VENOUS   Normal compressibility of the common femoral, superficial femoral, and popliteal veins. Visualized portions of profunda femoral vein and great saphenous vein unremarkable. there is a single noncompressible right posterior tibial vein containing hypoechoic thrombus. Another posterior tibial  vein is patent. Gastroc vein compressible.   Limited views of the contralateral common femoral vein are unremarkable.   OTHER   subcutaneous edema in the ankle.   Limitations: none   IMPRESSION: POSITIVE for isolated right posterior tibial (calf) DVT.  ASSESSMENT: Location of DVT: Right distal vein Cause of DVT: provoked by a transient risk factor  Patient without prior history of DVT diagnosed with DVT in one of the paired right posterior tibial veins on 05/29/24 and started on the Eliquis VTE starter pack by ortho. DVT provoked by recent right TKA. Will therefore plan to anticoagulate for a total of 3 months. Tolerating Eliquis well with symptom improvement. Provided refills to complete this treatment today. No barriers to medication access or adherence identified today. Counseled patient extensively on Eliquis and all questions answered. No need for repeat imaging at end of treatment.   PLAN: -Continue apixaban (Eliquis) 5 mg twice daily. -Expected duration of therapy: 3 months. Therapy started on 05/29/24. -Patient educated on purpose, proper use and potential adverse effects of apixaban (Eliquis). -Discussed importance of taking medication around the same  time every day. -Advised patient of medications to avoid (NSAIDs, aspirin doses >100 mg daily). -Educated that Tylenol (acetaminophen) is the preferred analgesic to lower the risk of bleeding. -Advised patient to alert all providers of anticoagulation therapy prior to starting a new medication or having a procedure. -Emphasized importance of monitoring for signs and symptoms of bleeding (abnormal bruising, prolonged bleeding, nose bleeds, bleeding from gums, discolored urine, black tarry stools). -Educated patient to present to the ED if emergent signs and symptoms of new thrombosis occur. -Counseled patient to wear compression stockings daily, removing at night.  Follow up: DVT Clinic as needed  Lum Herald, PharmD, [place],  CPP [facility] [phone]
